# Patient Record
Sex: Female | Born: 1955 | Race: White | Hispanic: No | Marital: Married | Smoking: Never smoker
Health system: Southern US, Community
[De-identification: ages and names within clinical notes are randomized; demographics above are authoritative.]

## PROBLEM LIST (undated history)

## (undated) DIAGNOSIS — R1013 Epigastric pain: Secondary | ICD-10-CM

## (undated) DIAGNOSIS — E785 Hyperlipidemia, unspecified: Secondary | ICD-10-CM

## (undated) DIAGNOSIS — M8580 Other specified disorders of bone density and structure, unspecified site: Secondary | ICD-10-CM

## (undated) DIAGNOSIS — E559 Vitamin D deficiency, unspecified: Secondary | ICD-10-CM

## (undated) DIAGNOSIS — G2581 Restless legs syndrome: Secondary | ICD-10-CM

## (undated) DIAGNOSIS — J309 Allergic rhinitis, unspecified: Secondary | ICD-10-CM

## (undated) DIAGNOSIS — I10 Essential (primary) hypertension: Secondary | ICD-10-CM

## (undated) DIAGNOSIS — R413 Other amnesia: Secondary | ICD-10-CM

## (undated) DIAGNOSIS — G4700 Insomnia, unspecified: Secondary | ICD-10-CM

## (undated) DIAGNOSIS — E7800 Pure hypercholesterolemia, unspecified: Secondary | ICD-10-CM

## (undated) HISTORY — DX: Hyperlipidemia, unspecified: E78.5

## (undated) HISTORY — DX: Restless legs syndrome: G25.81

## (undated) HISTORY — DX: Allergic rhinitis, unspecified: J30.9

## (undated) HISTORY — DX: Other amnesia: R41.3

## (undated) HISTORY — DX: Epigastric pain: R10.13

## (undated) HISTORY — DX: Insomnia, unspecified: G47.00

## (undated) HISTORY — DX: Vitamin D deficiency, unspecified: E55.9

## (undated) HISTORY — DX: Other specified disorders of bone density and structure, unspecified site: M85.80

## (undated) HISTORY — DX: Pure hypercholesterolemia, unspecified: E78.00

## (undated) HISTORY — DX: Essential (primary) hypertension: I10

---

## 1998-06-18 ENCOUNTER — Encounter: Payer: Self-pay | Admitting: Family Medicine

## 1998-06-18 ENCOUNTER — Ambulatory Visit (HOSPITAL_COMMUNITY): Admission: RE | Admit: 1998-06-18 | Discharge: 1998-06-18 | Payer: Self-pay | Admitting: Family Medicine

## 1998-09-28 ENCOUNTER — Encounter: Payer: Self-pay | Admitting: Family Medicine

## 1998-09-28 ENCOUNTER — Ambulatory Visit (HOSPITAL_COMMUNITY): Admission: RE | Admit: 1998-09-28 | Discharge: 1998-09-28 | Payer: Self-pay | Admitting: Family Medicine

## 1998-11-08 ENCOUNTER — Other Ambulatory Visit: Admission: RE | Admit: 1998-11-08 | Discharge: 1998-11-08 | Payer: Self-pay | Admitting: Obstetrics & Gynecology

## 1999-10-30 ENCOUNTER — Ambulatory Visit (HOSPITAL_COMMUNITY): Admission: RE | Admit: 1999-10-30 | Discharge: 1999-10-30 | Payer: Self-pay | Admitting: Family Medicine

## 1999-10-30 ENCOUNTER — Encounter: Payer: Self-pay | Admitting: Family Medicine

## 1999-11-07 ENCOUNTER — Ambulatory Visit (HOSPITAL_COMMUNITY): Admission: RE | Admit: 1999-11-07 | Discharge: 1999-11-07 | Payer: Self-pay | Admitting: Family Medicine

## 1999-11-07 ENCOUNTER — Encounter: Payer: Self-pay | Admitting: Family Medicine

## 1999-12-13 ENCOUNTER — Ambulatory Visit (HOSPITAL_COMMUNITY): Admission: RE | Admit: 1999-12-13 | Discharge: 1999-12-13 | Payer: Self-pay | Admitting: Gastroenterology

## 1999-12-25 ENCOUNTER — Ambulatory Visit (HOSPITAL_COMMUNITY): Admission: RE | Admit: 1999-12-25 | Discharge: 1999-12-25 | Payer: Self-pay | Admitting: Obstetrics and Gynecology

## 1999-12-25 ENCOUNTER — Encounter: Payer: Self-pay | Admitting: Gastroenterology

## 2004-01-30 ENCOUNTER — Other Ambulatory Visit: Admission: RE | Admit: 2004-01-30 | Discharge: 2004-01-30 | Payer: Self-pay | Admitting: Obstetrics & Gynecology

## 2013-09-02 ENCOUNTER — Other Ambulatory Visit (HOSPITAL_BASED_OUTPATIENT_CLINIC_OR_DEPARTMENT_OTHER): Payer: Self-pay | Admitting: Family Medicine

## 2013-09-02 ENCOUNTER — Ambulatory Visit (HOSPITAL_BASED_OUTPATIENT_CLINIC_OR_DEPARTMENT_OTHER)
Admission: RE | Admit: 2013-09-02 | Discharge: 2013-09-02 | Disposition: A | Discharge status: Home / Self Care | Payer: BC Managed Care – PPO | Source: Ambulatory Visit | Attending: Family Medicine | Admitting: Family Medicine

## 2013-09-02 DIAGNOSIS — R109 Unspecified abdominal pain: Secondary | ICD-10-CM

## 2013-09-02 DIAGNOSIS — K4020 Bilateral inguinal hernia, without obstruction or gangrene, not specified as recurrent: Secondary | ICD-10-CM | POA: Insufficient documentation

## 2013-09-02 DIAGNOSIS — K429 Umbilical hernia without obstruction or gangrene: Secondary | ICD-10-CM | POA: Insufficient documentation

## 2019-02-08 ENCOUNTER — Other Ambulatory Visit: Payer: Self-pay | Admitting: Physician Assistant

## 2019-02-08 DIAGNOSIS — R413 Other amnesia: Secondary | ICD-10-CM

## 2019-03-10 ENCOUNTER — Ambulatory Visit
Admission: RE | Admit: 2019-03-10 | Discharge: 2019-03-10 | Disposition: A | Discharge status: Home / Self Care | Payer: BC Managed Care – PPO | Source: Ambulatory Visit | Attending: Physician Assistant | Admitting: Physician Assistant

## 2019-03-10 ENCOUNTER — Other Ambulatory Visit: Payer: Self-pay

## 2019-03-10 DIAGNOSIS — R413 Other amnesia: Secondary | ICD-10-CM

## 2019-03-10 MED ORDER — GADOBENATE DIMEGLUMINE 529 MG/ML IV SOLN
14.0000 mL | Freq: Once | INTRAVENOUS | Status: AC | PRN
  Administered 2019-03-10: 18:00:00 14 mL via INTRAVENOUS

## 2019-03-14 ENCOUNTER — Encounter: Payer: Self-pay | Admitting: *Deleted

## 2019-03-14 ENCOUNTER — Other Ambulatory Visit: Payer: Self-pay | Admitting: *Deleted

## 2019-03-15 ENCOUNTER — Encounter: Payer: Self-pay | Admitting: Diagnostic Neuroimaging

## 2019-03-15 ENCOUNTER — Other Ambulatory Visit: Payer: Self-pay

## 2019-03-15 ENCOUNTER — Ambulatory Visit: Payer: BC Managed Care – PPO | Admitting: Diagnostic Neuroimaging

## 2019-03-15 VITALS — BP 110/70 | HR 64 | Temp 98.1°F | Ht 66.0 in | Wt 156.2 lb

## 2019-03-15 DIAGNOSIS — R413 Other amnesia: Secondary | ICD-10-CM | POA: Diagnosis not present

## 2019-03-15 NOTE — Progress Notes (Signed)
[facility]  PATIENT: Dawn Ross DOB: 07/15/1956  REFERRING CLINICIAN: Eloy EndK Nelson, PA HISTORY FROM: patient and brother  REASON FOR VISIT: new consult    HISTORICAL  CHIEF COMPLAINT:  Chief Complaint  Patient presents with  . Memory Loss    rm 7 New Pt, brother - Tom, MMSE 18    HISTORY OF PRESENT ILLNESS:   [age] year old female here for evaluation of memory loss.  Her past 1 year patient has had gradual onset and progressive word finding difficulties, short-term memory lapses, forgetting names, blank feeling.  She has been under significantly more stress in the same timeframe.  Patient retired from being a Runner, broadcasting/film/videoteacher about 1 year ago.  Now she is at home and feels like she does not get help from her spouse or her son.  Her son who lives at home is adopted and has some social issues of his own.  Patient's brother brings up high stress levels at home.  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  When patient is taking care of this infant she seems to not have any cognitive or memory problems and feels her mood is elevated.  Patient still able to do all of her ADLs.  She able to shop, drive, maintain household chores.   REVIEW OF SYSTEMS: Full 14 system review of systems performed and negative with exception of: As per HPI.  ALLERGIES: Allergies  Allergen Reactions  . Penicillins Rash    HOME MEDICATIONS: Outpatient Medications Prior to Visit  Medication Sig Dispense Refill  . bisoprolol (ZEBETA) 5 MG tablet Take 2.5 mg by mouth daily.    . Cholecalciferol (VITAMIN D3 PO) Take 3,000 Units by mouth daily.    . hydrochlorothiazide (HYDRODIURIL) 25 MG tablet Take 25 mg by mouth daily.    . Multiple Vitamin (MULTIVITAMIN) tablet Take 1 tablet by mouth daily.    Marland Kitchen. UNABLE TO FIND Med Name: calcium, magnesium, zinc    . cetirizine (ZYRTEC) 10 MG tablet Take 10 mg by mouth daily.    . Cyanocobalamin (VITAMIN  B-12 ER PO) Take 1 tablet by mouth daily.    . fluticasone (FLONASE) 50 MCG/ACT nasal spray Place into both nostrils daily.    Marland Kitchen. ibandronate (BONIVA) 150 MG tablet Take 150 mg by mouth every 30 (thirty) days. Take in the morning with a full glass of water, on an empty stomach, and do not take anything else by mouth or lie down for the next 30 min.     No facility-administered medications prior to visit.     PAST MEDICAL HISTORY: Past Medical History:  Diagnosis Date  . Allergic rhinitis   . Dyspepsia   . Hypercholesterolemia   . Hyperlipidemia   . Hypertension   . Insomnia   . Memory changes   . Osteopenia   . RLS (restless legs syndrome)   . Vitamin D deficiency     PAST SURGICAL HISTORY: Past Surgical History:  Procedure Laterality Date  . ABDOMINAL HYSTERECTOMY      FAMILY HISTORY: No family history on file.  SOCIAL HISTORY: Social History   Socioeconomic History  . Marital status: Married    Spouse name: Not on file  . Number of children: 2  . Years of education: Not on file  . Highest education level: Master's degree (e.g., MA, MS, MEng, MEd, MSW, MBA)  Occupational History    Comment: retired Runner, broadcasting/film/videoteacher  Social Needs  . Financial  resource strain: Not on file  . Food insecurity    Worry: Not on file    Inability: Not on file  . Transportation needs    Medical: Not on file    Non-medical: Not on file  Tobacco Use  . Smoking status: Never Smoker  . Smokeless tobacco: Never Used  Substance and Sexual Activity  . Alcohol use: Never    Frequency: Never  . Drug use: Never  . Sexual activity: Not on file  Lifestyle  . Physical activity    Days per week: Not on file    Minutes per session: Not on file  . Stress: Not on file  Relationships  . Social Herbalist on phone: Not on file    Gets together: Not on file    Attends religious service: Not on file    Active member of club or organization: Not on file    Attends meetings of clubs or  organizations: Not on file    Relationship status: Not on file  . Intimate partner violence    Fear of current or ex partner: Not on file    Emotionally abused: Not on file    Physically abused: Not on file    Forced sexual activity: Not on file  Other Topics Concern  . Not on file  Social History Narrative   Lives with husband, son   Caffeine- none     PHYSICAL EXAM  GENERAL EXAM/CONSTITUTIONAL: Vitals:  Vitals:   03/15/19 1256  BP: 110/70  Pulse: 64  Temp: 98.1 F (36.7 C)  Weight: 156 lb 3.2 oz (70.9 kg)  Height: 5\' 6"  (1.676 m)     Body mass index is 25.21 kg/m. Wt Readings from Last 3 Encounters:  03/15/19 156 lb 3.2 oz (70.9 kg)     Patient is in no distress; well developed, nourished and groomed; neck is supple  MILDLY TEARFUL  GOOD INSIGHT  CARDIOVASCULAR:  Examination of carotid arteries is normal; no carotid bruits  Regular rate and rhythm, no murmurs  Examination of peripheral vascular system by observation and palpation is normal  EYES:  Ophthalmoscopic exam of optic discs and posterior segments is normal; no papilledema or hemorrhages  No exam data present  MUSCULOSKELETAL:  Gait, strength, tone, movements noted in Neurologic exam below  NEUROLOGIC: MENTAL STATUS:  MMSE - [place] Exam 03/15/2019  Orientation to time 2  Orientation to Place 3  Registration 3  Attention/ Calculation 0  Recall 2  Language- name 2 objects 2  Language- repeat 1  Language- follow 3 step command 3  Language- read & follow direction 1  Write a sentence 1  Copy design 0  Total score 18    awake, alert, oriented to person, place and time  recent and remote memory intact  normal attention and concentration  language fluent, comprehension intact, naming intact  fund of knowledge appropriate  CRANIAL NERVE:   2nd - no papilledema on fundoscopic exam  2nd, 3rd, 4th, 6th - pupils equal and reactive to light, visual fields full to  confrontation, extraocular muscles intact, no nystagmus  5th - facial sensation symmetric  7th - facial strength symmetric  8th - hearing intact  9th - palate elevates symmetrically, uvula midline  11th - shoulder shrug symmetric  12th - tongue protrusion midline  MOTOR:   normal bulk and tone, full strength in the BUE, BLE  SENSORY:   normal and symmetric to light touch, temperature, vibration  COORDINATION:  finger-nose-finger, fine finger movements normal  REFLEXES:   deep tendon reflexes present and symmetric  GAIT/STATION:   narrow based gait     DIAGNOSTIC DATA (LABS, IMAGING, TESTING) - I reviewed patient records, labs, notes, testing and imaging myself where available.  No results found for: WBC, HGB, HCT, MCV, PLT No results found for: NA, K, CL, CO2, GLUCOSE, BUN, CREATININE, CALCIUM, PROT, ALBUMIN, AST, ALT, ALKPHOS, BILITOT, GFRNONAA, GFRAA No results found for: CHOL, HDL, LDLCALC, LDLDIRECT, TRIG, CHOLHDL No results found for: WGNF6OHGBA1C No results found for: VITAMINB12 No results found for: TSH   03/10/19 MRI brain [I reviewed images myself and agree with interpretation. -[facility]]  - No evidence of intracranial mass or acute intracranial abnormality. - Moderate generalized parenchymal atrophy, progressed since prior head CT 04/26/2007.    ASSESSMENT AND PLAN  63 y.o. 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  MMSE 18 out of 30.  Interestingly her ADLs do not seem to be significantly affected.  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  Neurodegenerative dementia is possible but less likely at this time.   Ddx: pseudo-dementia (vs MCI or mild dementia)  1. Memory loss       PLAN:  MEMORY LOSS - brain health activities reviewed - caution with safety and supervision  issues - consider psychology eval for stress mgmt - if not improving in next 3-6 months, then consider neuropsychology testing  Return in about 6 months (around 09/15/2019).    Suanne MarkerVIKRAM R. , MD 03/15/2019, 1:36 PM Certified in Neurology, Neurophysiology and Neuroimaging  [facility] [address], [address] [place], [place] [phone]

## 2019-03-15 NOTE — Patient Instructions (Signed)
  MEMORY LOSS - brain health activities reviewed - consider psychology eval for stress mgmt - if not improving in next 3-6 months, then consider neuropsychology testing

## 2019-09-19 ENCOUNTER — Ambulatory Visit: Payer: BC Managed Care – PPO | Admitting: Diagnostic Neuroimaging

## 2019-09-19 ENCOUNTER — Telehealth: Payer: Self-pay | Admitting: *Deleted

## 2019-09-19 ENCOUNTER — Encounter: Payer: Self-pay | Admitting: Diagnostic Neuroimaging

## 2019-09-19 NOTE — Telephone Encounter (Signed)
Patient was no show for follow up today. 

## 2020-12-10 IMAGING — MR MRI HEAD WITHOUT AND WITH CONTRAST
12 series · 48 of 48 positions shown · IV contrast (multihance)
Comparison: Head CT 04/26/2007

CLINICAL DATA: Memory changes. Additional history: Patient reports
worsening memory loss and confusion for 1 year.

Creatinine was obtained on site at [HOSPITAL] at [HOSPITAL].
Results: Creatinine 0.9 mg/dL (GFR 68).
EXAM:
MRI HEAD WITHOUT AND WITH CONTRAST
TECHNIQUE: Multiplanar, multiecho pulse sequences of the brain and surrounding
structures were obtained without and with intravenous contrast.
CONTRAST:  14mL MULTIHANCE GADOBENATE DIMEGLUMINE 529 MG/ML IV SOLN

[Series 2: T1 · sagittal · 5.0mm · 0.45mm/px · 1 of 24 slices shown]
[im 1/24]
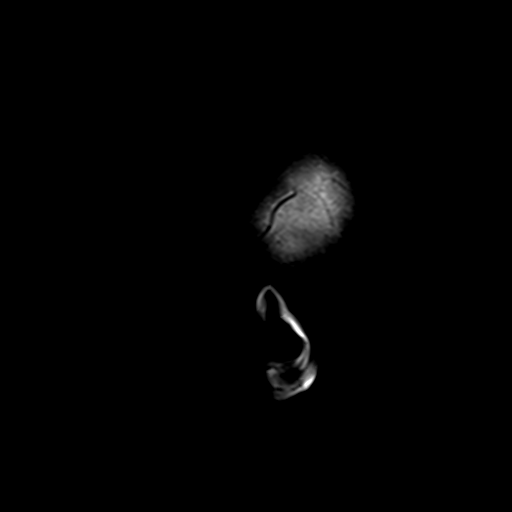

[Series 3: DWI · axial · 3.0mm · 1.80mm/px · z∈[-7,+146]mm · 7 of 104 slices shown (1 of 4)]
[im 1/104]
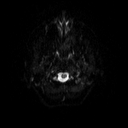
[im 18/104]
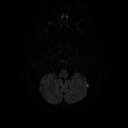
[im 35/104]
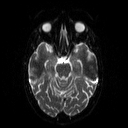
[im 52/104]
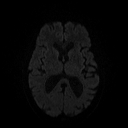
[im 69/104]
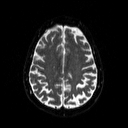
[im 86/104]
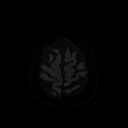
[im 104/104]
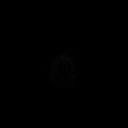

[Series 4: DWI · axial · 3.0mm · 1.80mm/px · z∈[-7,+146]mm · 3 of 49 slices shown (2 of 4)]
[im 1/49]
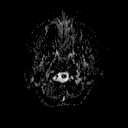
[im 25/49]
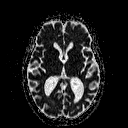
[im 49/49]
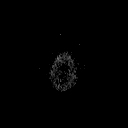

[Series 5: FLAIR · axial · 3.0mm · 0.45mm/px · z∈[-7,+146]mm · 2 of 34 slices shown]
[im 1/34]
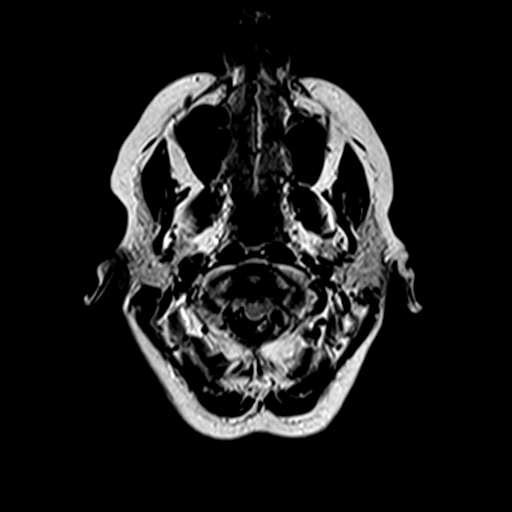
[im 34/34]
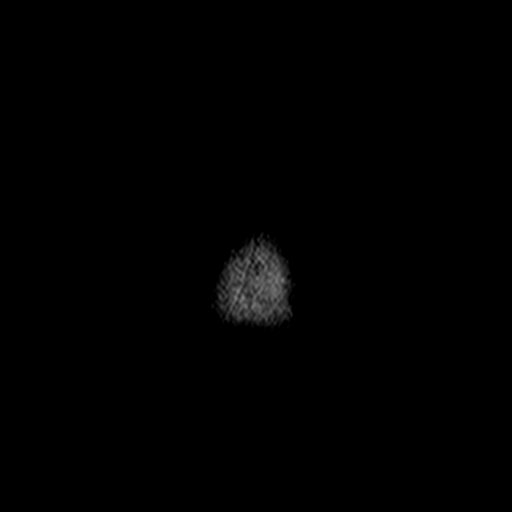

[Series 6: T2 · axial · 5.0mm · 0.72mm/px · z∈[-9,+146]mm · 2 of 24 slices shown (1 of 2)]
[im 1/24]
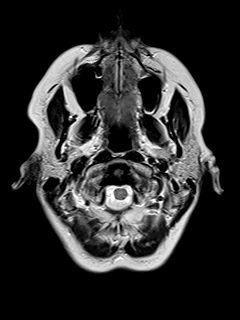
[im 24/24]
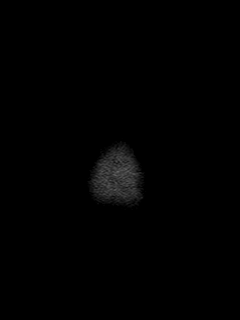

[Series 8: swi_images · axial · 5.0mm · 0.90mm/px · z∈[-8,+147]mm · 2 of 32 slices shown]
[im 1/32]
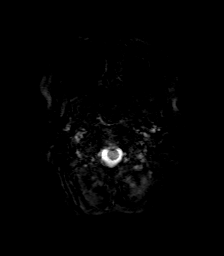
[im 32/32]
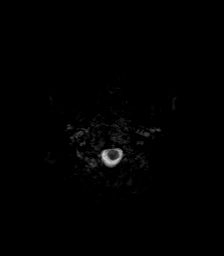

[Series 9: t1_mpr_tra · axial · 1.0mm · 0.72mm/px · z∈[-11,+148]mm · 10 of 160 slices shown (1 of 2)]
[im 1/160]
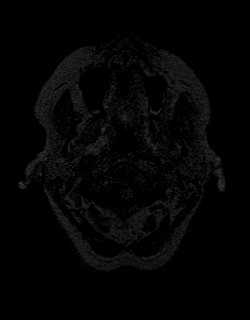
[im 18/160]
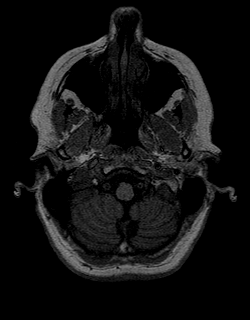
[im 36/160]
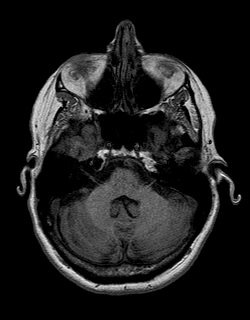
[im 54/160]
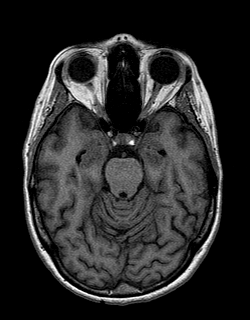
[im 71/160]
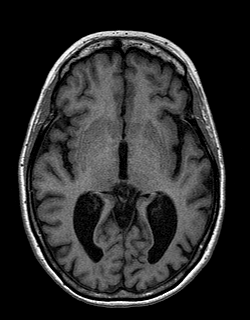
[im 89/160]
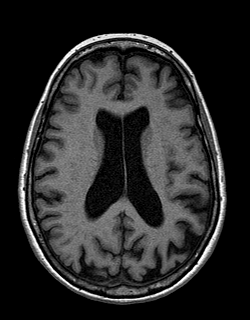
[im 107/160]
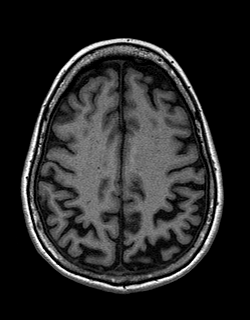
[im 124/160]
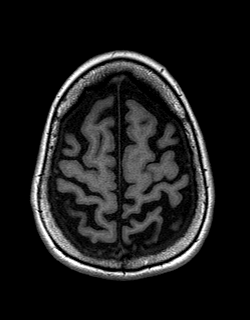
[im 142/160]
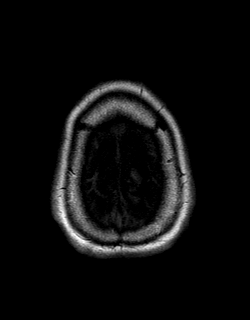
[im 160/160]
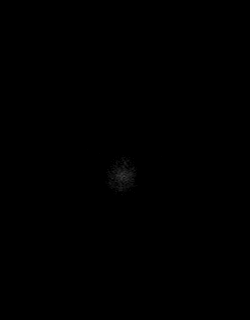

[Series 10: DWI · coronal · 5.0mm · 1.80mm/px · 5 of 73 slices shown (3 of 4)]
[im 1/73]
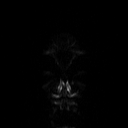
[im 19/73]
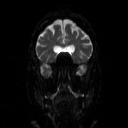
[im 37/73]
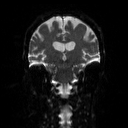
[im 55/73]
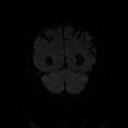
[im 73/73]
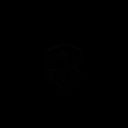

[Series 11: DWI · coronal · 5.0mm · 1.80mm/px · 2 of 38 slices shown (4 of 4)]
[im 1/38]
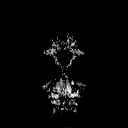
[im 38/38]
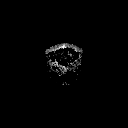

[Series 12: T2 · coronal · 5.0mm · 0.45mm/px · 2 of 29 slices shown (2 of 2)]
[im 1/29]
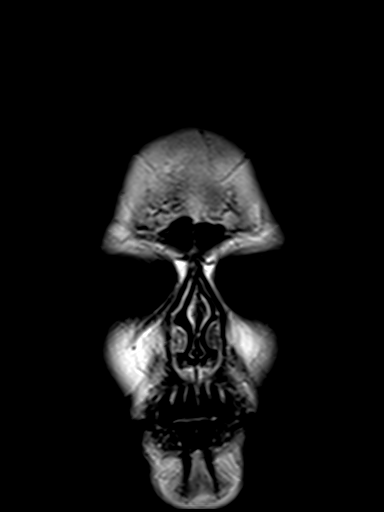
[im 29/29]
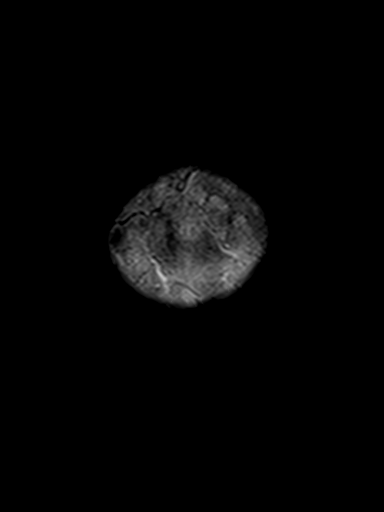

[Series 13: t1_mpr_tra · axial · 1.0mm · 0.72mm/px · z∈[-11,+148]mm · 10 of 160 slices shown (2 of 2)]
[im 1/160]
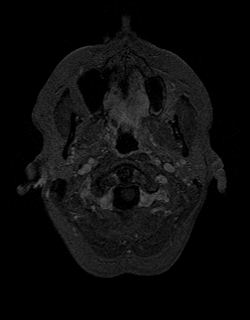
[im 18/160]
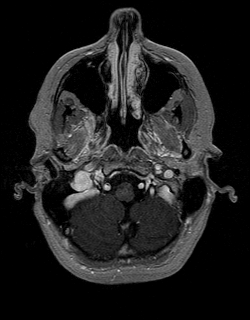
[im 36/160]
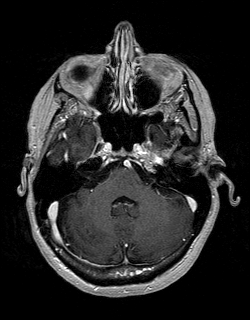
[im 54/160]
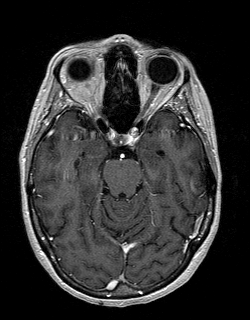
[im 71/160]
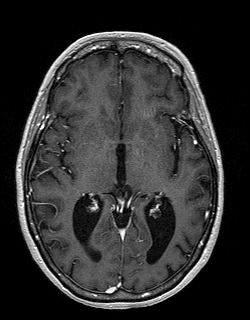
[im 89/160]
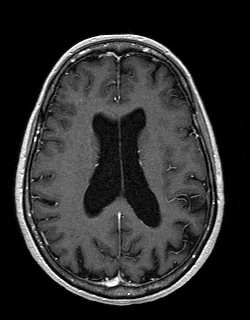
[im 107/160]
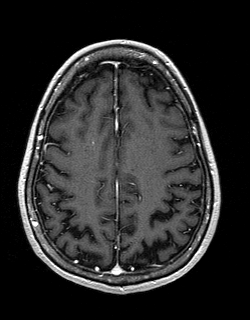
[im 124/160]
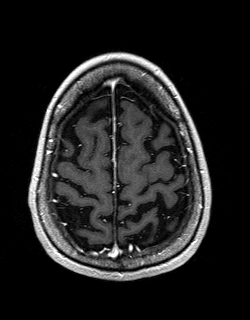
[im 142/160]
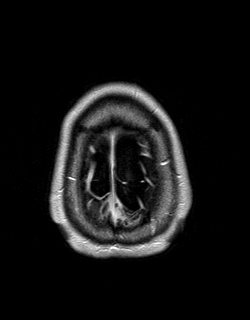
[im 160/160]
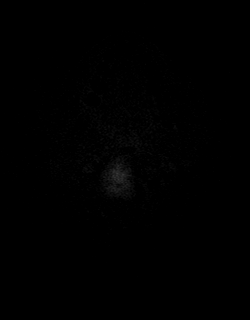

[Series 14: post cor · coronal · 5.0mm · 0.45mm/px · 2 of 29 slices shown]
[im 1/29]
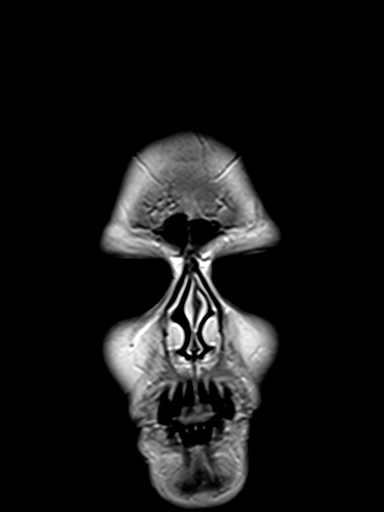
[im 29/29]
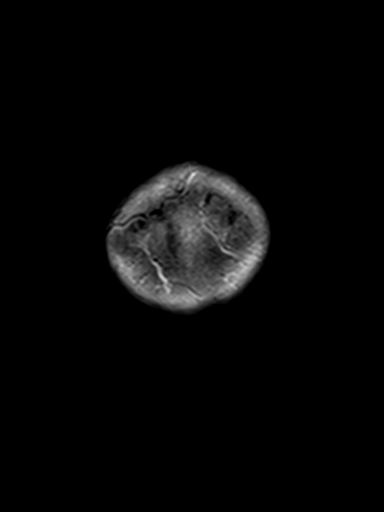

[48 of 48 positions shown; findings below may reference images not displayed]

FINDINGS: Brain: No restricted diffusion is demonstrated to suggest acute or
recent subacute infarction. No evidence of intracranial mass. No
chronic intracranial hemorrhage. No midline shift or extra-axial
collection. Mild scattered and ill-defined T2/FLAIR hyperintensity
within the cerebral white matter is nonspecific, but consistent with
chronic small vessel ischemic disease. Incidentally noted small
right frontal lobe developmental venous anomaly. No other abnormal
intracranial enhancement. Moderate generalized parenchymal atrophy
has progressed since prior head CT 04/26/2007.

Vascular: Flow voids maintained within the proximal large vessels.

Skull and upper cervical spine: Normal marrow signal.

Sinuses/Orbits: The imaged globes and orbits are unremarkable. No
significant paranasal sinus disease or mastoid effusion.
IMPRESSION: No evidence of intracranial mass or acute intracranial abnormality.

Moderate generalized parenchymal atrophy, progressed since prior
head CT 04/26/2007.

Mild chronic small vessel ischemic disease.
